# Patient Record
Sex: Male | Born: 1960 | Race: Black or African American | Hispanic: No | Marital: Single | Smoking: Current some day smoker
Health system: Southern US, Community
[De-identification: ages and names within clinical notes are randomized; demographics above are authoritative.]

---

## 1997-12-25 ENCOUNTER — Encounter: Admission: RE | Admit: 1997-12-25 | Discharge: 1997-12-25 | Payer: Self-pay | Admitting: Infectious Diseases

## 1998-01-15 ENCOUNTER — Encounter: Admission: RE | Admit: 1998-01-15 | Discharge: 1998-01-15 | Payer: Self-pay | Admitting: Infectious Diseases

## 1998-09-24 ENCOUNTER — Ambulatory Visit (HOSPITAL_COMMUNITY): Admission: RE | Admit: 1998-09-24 | Discharge: 1998-09-24 | Payer: Self-pay | Admitting: Infectious Diseases

## 1998-09-24 ENCOUNTER — Encounter: Admission: RE | Admit: 1998-09-24 | Discharge: 1998-09-24 | Payer: Self-pay | Admitting: Infectious Diseases

## 1998-12-03 ENCOUNTER — Encounter: Admission: RE | Admit: 1998-12-03 | Discharge: 1998-12-03 | Payer: Self-pay | Admitting: Infectious Diseases

## 2001-03-18 ENCOUNTER — Encounter: Payer: Self-pay | Admitting: Family Medicine

## 2001-03-18 ENCOUNTER — Encounter: Admission: RE | Admit: 2001-03-18 | Discharge: 2001-03-18 | Payer: Self-pay | Admitting: Family Medicine

## 2001-04-07 ENCOUNTER — Encounter: Admission: RE | Admit: 2001-04-07 | Discharge: 2001-07-06 | Payer: Self-pay

## 2001-08-09 ENCOUNTER — Ambulatory Visit (HOSPITAL_COMMUNITY): Admission: RE | Admit: 2001-08-09 | Discharge: 2001-08-09 | Payer: Self-pay | Admitting: Orthopedic Surgery

## 2001-08-09 ENCOUNTER — Encounter: Payer: Self-pay | Admitting: Orthopedic Surgery

## 2008-10-30 ENCOUNTER — Emergency Department (HOSPITAL_COMMUNITY): Admission: EM | Admit: 2008-10-30 | Discharge: 2008-10-30 | Payer: Self-pay | Admitting: Emergency Medicine

## 2013-06-08 ENCOUNTER — Encounter (HOSPITAL_COMMUNITY): Payer: Self-pay | Admitting: Emergency Medicine

## 2013-06-08 ENCOUNTER — Emergency Department (HOSPITAL_COMMUNITY)
Admission: EM | Admit: 2013-06-08 | Discharge: 2013-06-08 | Disposition: A | Discharge status: Home / Self Care | Payer: Self-pay | Attending: Emergency Medicine | Admitting: Emergency Medicine

## 2013-06-08 ENCOUNTER — Emergency Department (HOSPITAL_COMMUNITY): Payer: Self-pay

## 2013-06-08 DIAGNOSIS — F17200 Nicotine dependence, unspecified, uncomplicated: Secondary | ICD-10-CM | POA: Insufficient documentation

## 2013-06-08 DIAGNOSIS — R079 Chest pain, unspecified: Secondary | ICD-10-CM

## 2013-06-08 DIAGNOSIS — R071 Chest pain on breathing: Secondary | ICD-10-CM | POA: Insufficient documentation

## 2013-06-08 LAB — POCT I-STAT TROPONIN I: Troponin i, poc: 0 ng/mL (ref 0.00–0.08)

## 2013-06-08 LAB — CBC
Hemoglobin: 14.3 g/dL (ref 13.0–17.0)
MCH: 29.6 pg (ref 26.0–34.0)
Platelets: 175 10*3/uL (ref 150–400)
RBC: 4.83 MIL/uL (ref 4.22–5.81)
RDW: 13.3 % (ref 11.5–15.5)

## 2013-06-08 LAB — BASIC METABOLIC PANEL
CO2: 28 mEq/L (ref 19–32)
Calcium: 9.3 mg/dL (ref 8.4–10.5)
Chloride: 102 mEq/L (ref 96–112)
GFR calc Af Amer: 77 mL/min — ABNORMAL LOW (ref >90)
GFR calc non Af Amer: 66 mL/min — ABNORMAL LOW (ref >90)
Glucose, Bld: 97 mg/dL (ref 70–99)
Sodium: 139 mEq/L (ref 135–145)

## 2013-06-08 MED ORDER — IBUPROFEN 200 MG PO TABS
400.0000 mg | ORAL_TABLET | Freq: Once | ORAL | Status: AC
  Administered 2013-06-08: 400 mg via ORAL
  Filled 2013-06-08: qty 2

## 2013-06-08 NOTE — ED Provider Notes (Signed)
CSN: [id number]     Arrival date & time 06/08/13  1758 History   First MD Initiated Contact with Patient 06/08/13 2232     Chief Complaint  Patient presents with  . Chest Pain   (Consider location/radiation/quality/duration/timing/severity/associated sxs/prior Treatment) HPI Comments: Jencarlos Nicolson is a 52 y.o. male who complains of left sided, dull, chest pain, radiating to his left arm, for 2 days. The pain is intermittent and caused by movement only. He has not taken anything for it. He does not have any associated with dizziness, nausea, vomiting, sweating, cough, shortness of breath, or abdominal pain. He's never had this previously. There are no other known modifying factors  Patient is a 52 y.o. male presenting with chest pain. The history is provided by the patient.  Chest Pain   History reviewed. No pertinent past medical history. History reviewed. No pertinent past surgical history. No family history on file. History  Substance Use Topics  . Smoking status: Current Some Day Smoker    Types: Cigars  . Smokeless tobacco: Not on file  . Alcohol Use: Yes     Comment: occasional    Review of Systems  Cardiovascular: Positive for chest pain.  All other systems reviewed and are negative.    Allergies  Review of patient's allergies indicates not on file.  Home Medications   Current Outpatient Rx  Name  Route  Sig  Dispense  Refill  . acetaminophen (TYLENOL) 500 MG tablet   Oral   Take 1,000 mg by mouth every 6 (six) hours as needed for pain.          BP 133/95  Pulse 47  Temp(Src) 98 F (36.7 C) (Oral)  Resp 16  Wt 176 lb 11.2 oz (80.151 kg)  SpO2 100% Physical Exam  Nursing note and vitals reviewed. Constitutional: He is oriented to person, place, and time. He appears well-developed and well-nourished.  HENT:  Head: Normocephalic and atraumatic.  Right Ear: External ear normal.  Left Ear: External ear normal.  Eyes: Conjunctivae and EOM are normal. Pupils  are equal, round, and reactive to light.  Neck: Normal range of motion and phonation normal. Neck supple.  Cardiovascular: Normal rate, regular rhythm, normal heart sounds and intact distal pulses.   Pulmonary/Chest: Effort normal and breath sounds normal. He exhibits tenderness (mild left upper anterior chest wall tenderness). He exhibits no bony tenderness.  Abdominal: Soft. Normal appearance. There is no tenderness.  Musculoskeletal: Normal range of motion. He exhibits no edema and no tenderness.  No deformities or large joints  Neurological: He is alert and oriented to person, place, and time. No cranial nerve deficit or sensory deficit. He exhibits normal muscle tone. Coordination normal.  Skin: Skin is warm, dry and intact.  Psychiatric: He has a normal mood and affect. His behavior is normal. Judgment and thought content normal.    ED Course  Procedures (including critical care time) Medications  ibuprofen (ADVIL,MOTRIN) tablet 400 mg (400 mg Oral Given 06/08/13 2303)      Labs Review Labs Reviewed  CBC - Abnormal; Notable for the following:    WBC 3.8 (*)    All other components within normal limits  BASIC METABOLIC PANEL - Abnormal; Notable for the following:    GFR calc non Af Amer 66 (*)    GFR calc Af Amer 77 (*)    All other components within normal limits  POCT I-STAT TROPONIN I   Imaging Review Dg Chest 2 View  06/08/2013  CLINICAL DATA:  Initial encounter for chest pain which began yesterday.  EXAM: CHEST  2 VIEW  COMPARISON:  None.  FINDINGS: Cardiomediastinal silhouette unremarkable. Lungs clear. Bronchovascular markings normal. Pulmonary vascularity normal. No visible pleural effusions. No pneumothorax. Visualized bony thorax intact.  IMPRESSION: Normal examination.   Electronically Signed   By: Hulan Saas M.D.   On: 06/08/2013 19:16    EKG Interpretation     Ventricular Rate:  72 PR Interval:  164 QRS Duration: 78 QT Interval:  366 QTC  Calculation: 400 R Axis:   0 Text Interpretation:  Normal sinus rhythm Right atrial enlargement Nonspecific T wave abnormality Abnormal ECG No old tracing to compare            MDM   1. Chest pain    No specific chest pain, with reassuring, evaluation and clinical syndrome. His pain is most consistent with chest wall source. He is due for discharge with outpatient management.  Nursing Notes Reviewed/ Care Coordinated, and agree without changes. Applicable Imaging Reviewed.  Interpretation of Laboratory Data incorporated into ED treatment   Plan: Home Medications- ibuprofen when necessary pain; Home Treatments and Observation- heat to affected area; return here if the recommended treatment, does not improve the symptoms; Recommended follow up- PCP of choice, and one or 2 weeks, for checkup      Flint Melter, MD 06/08/13 2313

## 2013-06-08 NOTE — ED Notes (Signed)
C/o L sided chest aching and pressure x 2 days.  Pain worse with movement (raising L arm up).  Denies nausea, vomiting, and sob.

## 2013-07-25 ENCOUNTER — Telehealth: Payer: Self-pay | Admitting: "Endocrinology

## 2013-07-27 NOTE — Telephone Encounter (Signed)
Entered this chart in error. I was looking for a different patient with the same name.

## 2014-06-24 ENCOUNTER — Emergency Department (HOSPITAL_COMMUNITY)
Admission: EM | Admit: 2014-06-24 | Discharge: 2014-06-24 | Disposition: A | Discharge status: Home / Self Care | Payer: 59 | Attending: Emergency Medicine | Admitting: Emergency Medicine

## 2014-06-24 ENCOUNTER — Encounter (HOSPITAL_COMMUNITY): Payer: Self-pay | Admitting: Emergency Medicine

## 2014-06-24 DIAGNOSIS — F1210 Cannabis abuse, uncomplicated: Secondary | ICD-10-CM | POA: Diagnosis not present

## 2014-06-24 DIAGNOSIS — R112 Nausea with vomiting, unspecified: Secondary | ICD-10-CM | POA: Diagnosis present

## 2014-06-24 DIAGNOSIS — R110 Nausea: Secondary | ICD-10-CM

## 2014-06-24 DIAGNOSIS — F1410 Cocaine abuse, uncomplicated: Secondary | ICD-10-CM | POA: Diagnosis not present

## 2014-06-24 DIAGNOSIS — Z720 Tobacco use: Secondary | ICD-10-CM | POA: Diagnosis not present

## 2014-06-24 DIAGNOSIS — F1910 Other psychoactive substance abuse, uncomplicated: Secondary | ICD-10-CM

## 2014-06-24 NOTE — ED Notes (Signed)
Bed: WA25 Expected date:  Expected time:  Means of arrival:  Comments: EMS  

## 2014-06-24 NOTE — ED Notes (Addendum)
Per EMS pt reports "feels strange" with vomiting post use of marijuana, four beers, and quarter ounce of crack.  Pt given 4 mg of zofran IV en route.

## 2014-06-24 NOTE — ED Provider Notes (Signed)
CSN: [id number]     Arrival date & time 06/24/14  1340 History   First MD Initiated Contact with Patient 06/24/14 1354     Chief Complaint  Patient presents with  . Emesis      HPI Patient presents to the emergency department after drinking alcohol, smoking cocaine, smoking marijuana.  He states "I just don't feel right".  He was slightly nauseated but received Zofran by EMS and feels much better at this time.  He denies chest pain or shortness of breath.  No abdominal pain.  No headache.  No weakness of his arms or legs.  No hallucinations.  He states he feels better at this time I would like to go home.   History reviewed. No pertinent past medical history. History reviewed. No pertinent past surgical history. No family history on file. History  Substance Use Topics  . Smoking status: Current Some Day Smoker    Types: Cigars  . Smokeless tobacco: Not on file  . Alcohol Use: Yes     Comment: occasional    Review of Systems  All other systems reviewed and are negative.     Allergies  Review of patient's allergies indicates no known allergies.  Home Medications   Prior to Admission medications   Not on File   BP 125/74  Pulse 90  Resp 14  Ht 6' (1.829 m)  Wt 185 lb (83.915 kg)  BMI 25.08 kg/m2  SpO2 99% Physical Exam  Nursing note and vitals reviewed. Constitutional: He is oriented to person, place, and time. He appears well-developed and well-nourished.  HENT:  Head: Normocephalic and atraumatic.  Eyes: EOM are normal.  Neck: Normal range of motion.  Cardiovascular: Normal rate, regular rhythm, normal heart sounds and intact distal pulses.   Pulmonary/Chest: Effort normal and breath sounds normal. No respiratory distress.  Abdominal: Soft. He exhibits no distension. There is no tenderness.  Musculoskeletal: Normal range of motion.  Neurological: He is alert and oriented to person, place, and time.  Skin: Skin is warm and dry.  Psychiatric: He has a normal  mood and affect. Judgment normal.    ED Course  Procedures (including critical care time) Labs Review Labs Reviewed - No data to display  Imaging Review No results found.   EKG Interpretation None      MDM   Final diagnoses:  Nausea  Substance abuse    Patient be discharged home at this time.  Medical screening examination complete.  Vital signs normal.  Discharge home in good condition.    Lyanne CoKevin M Ronica Vivian, MD 06/24/14 [phone]

## 2015-06-21 IMAGING — CR DG CHEST 2V
2 series · 2 of 2 positions shown · non-contrast
Comparison: None.

CLINICAL DATA: Initial encounter for chest pain which began
yesterday.

EXAM:
CHEST  2 VIEW

[w chest pa]
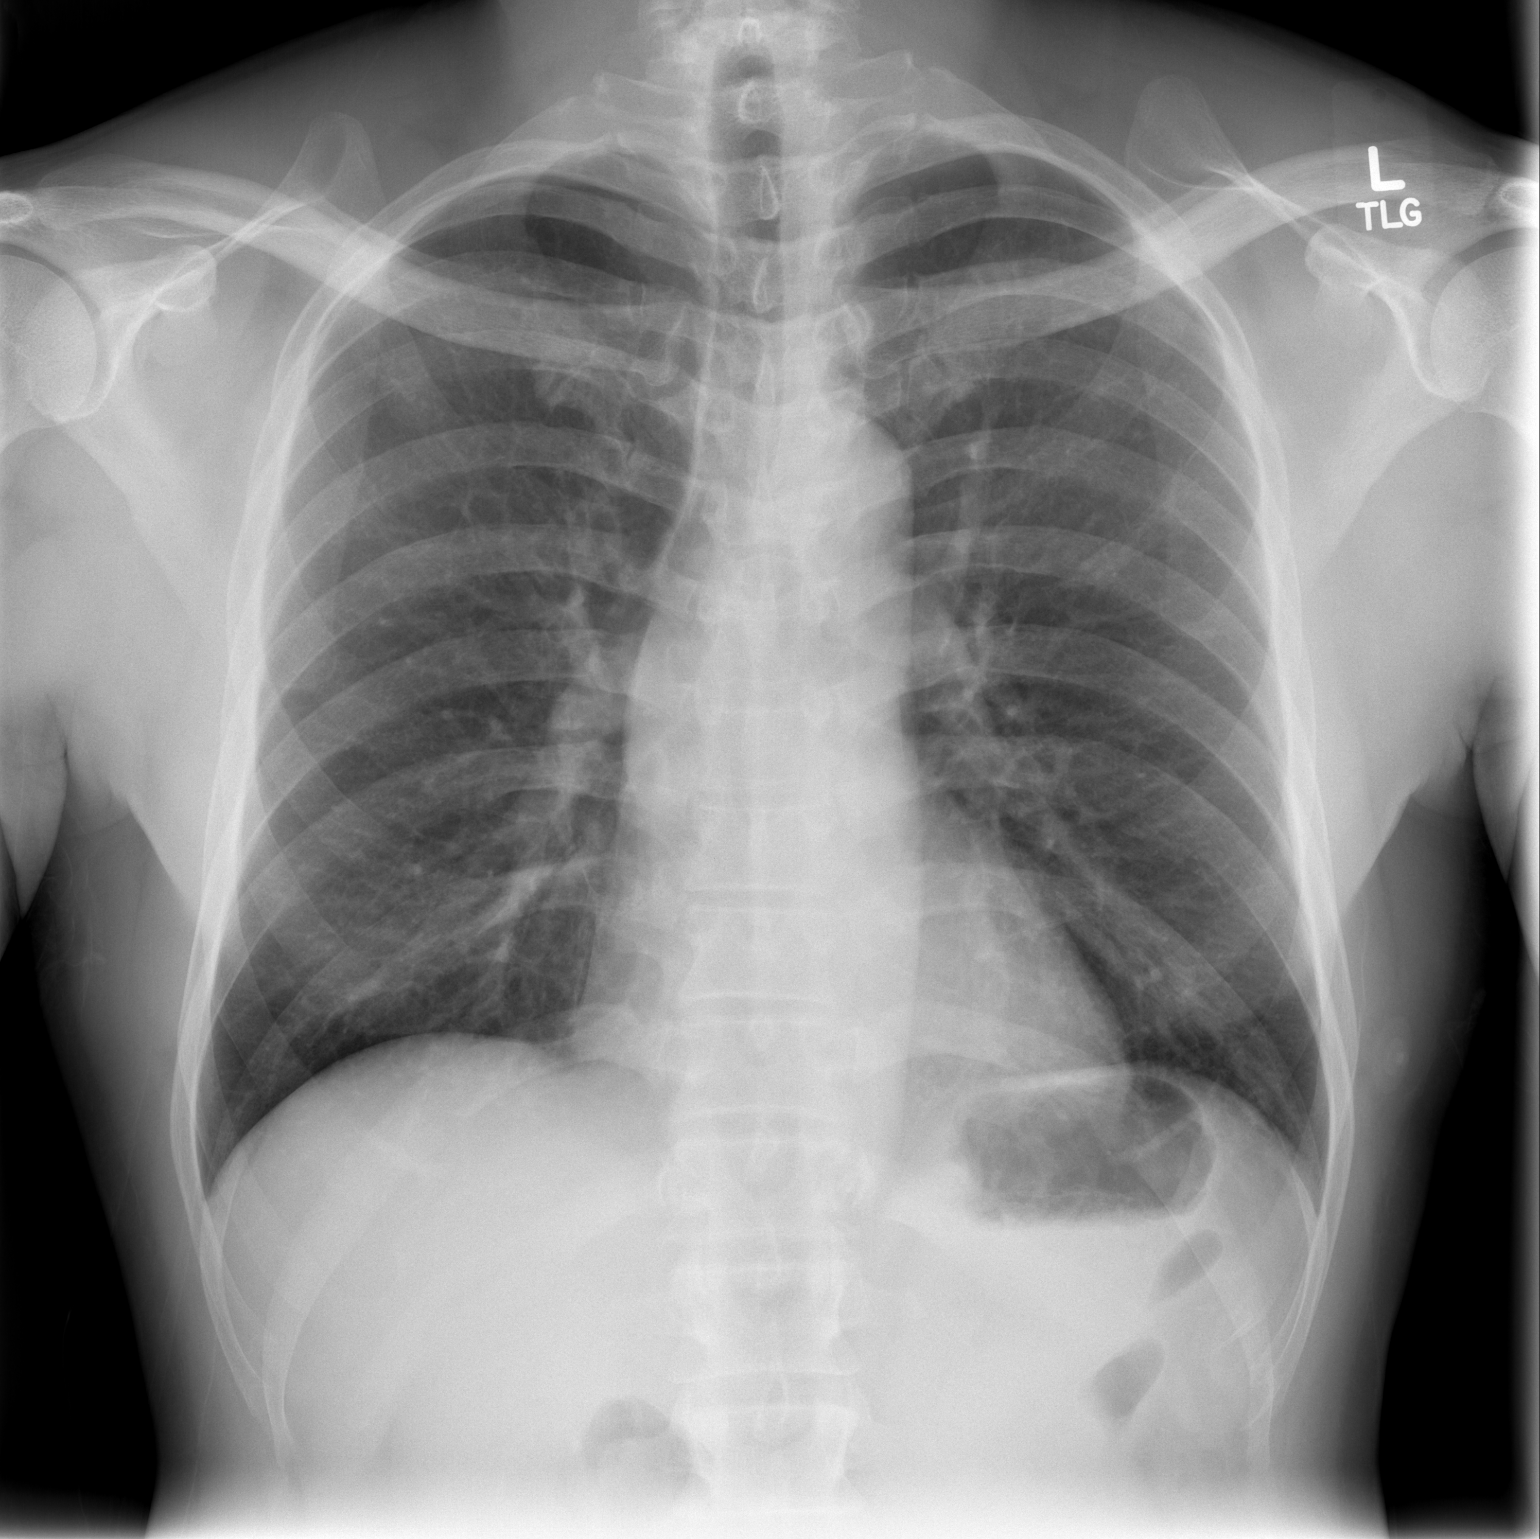

[w chest lat]
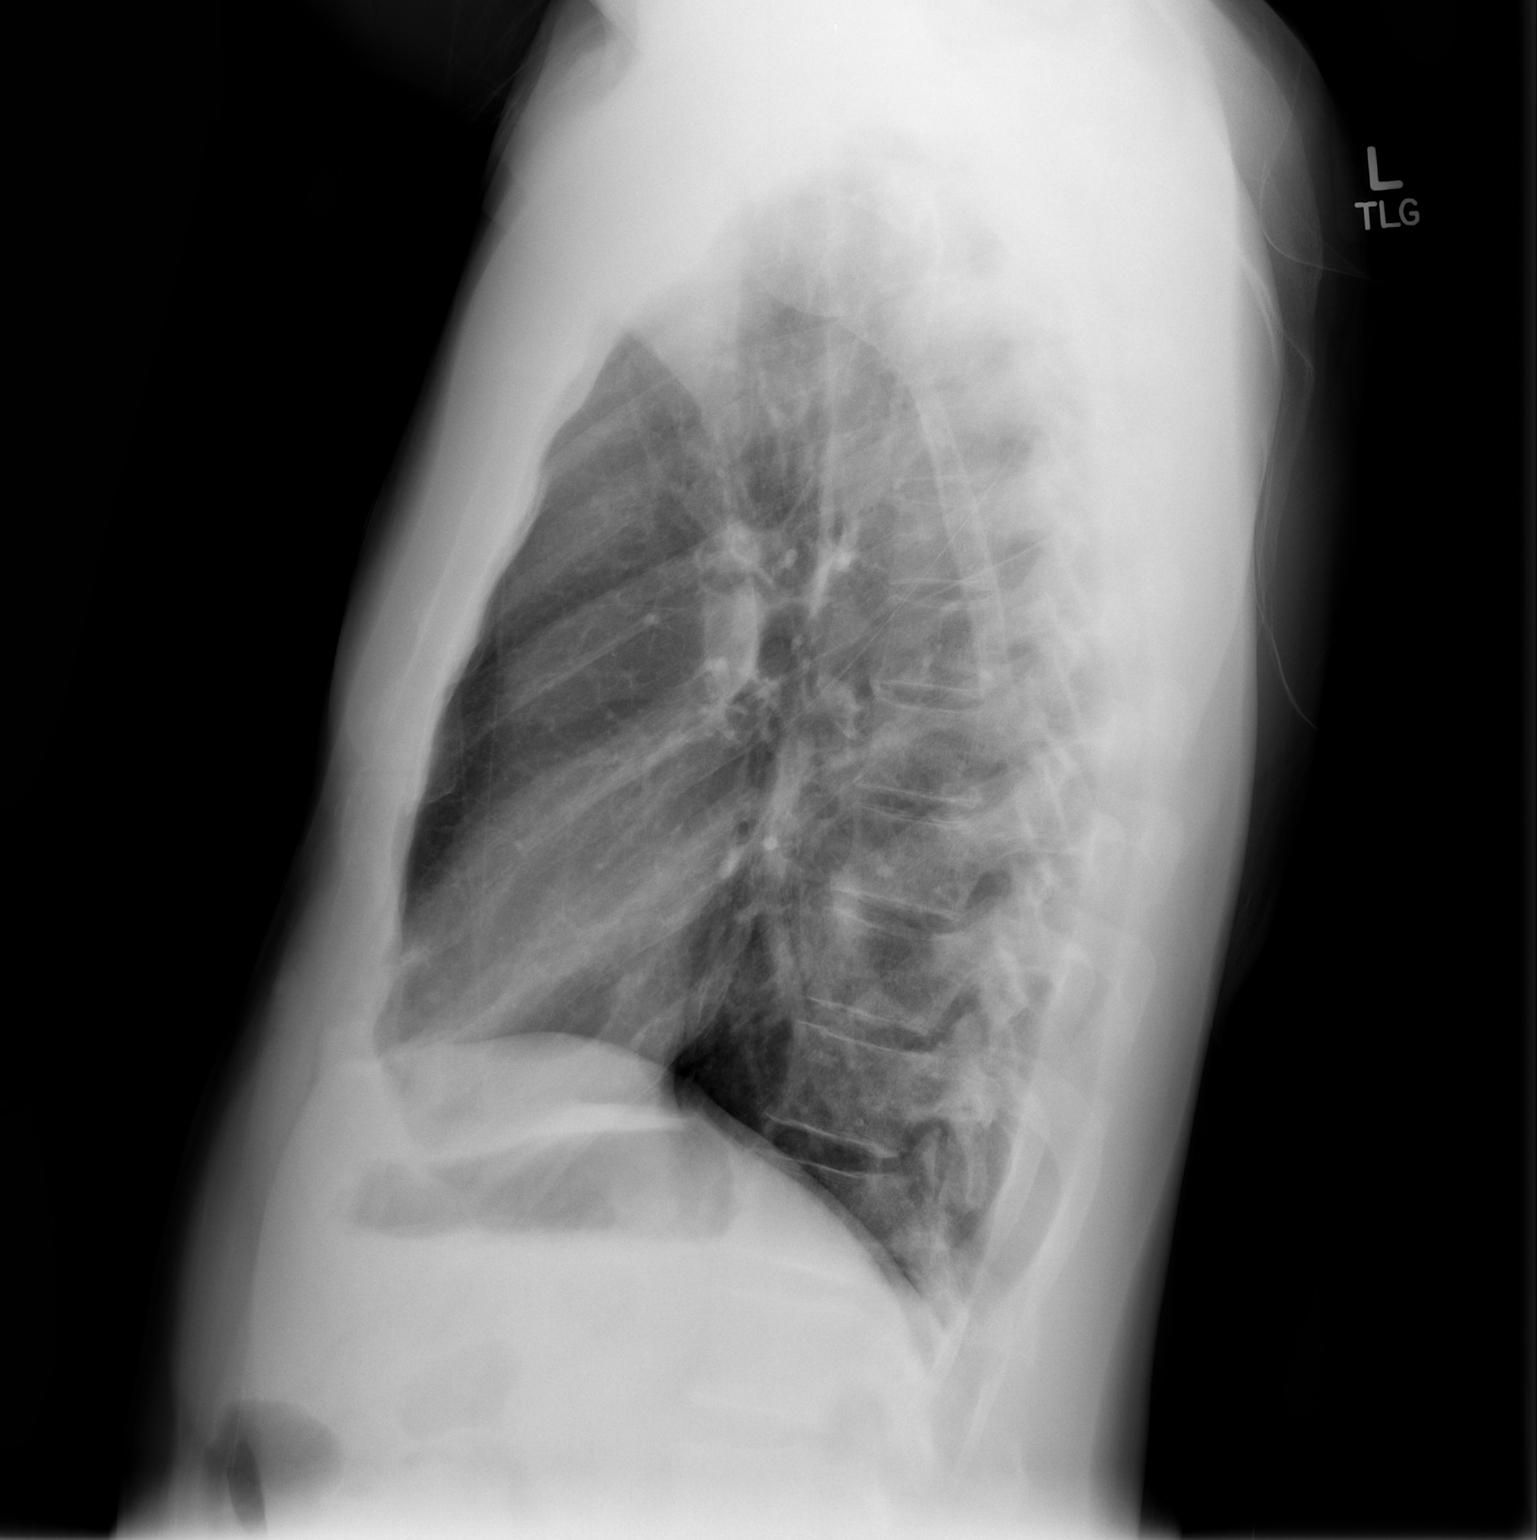

[2 of 2 positions shown; findings below may reference images not displayed]

FINDINGS: Cardiomediastinal silhouette unremarkable. Lungs clear.
Bronchovascular markings normal. Pulmonary vascularity normal. No
visible pleural effusions. No pneumothorax. Visualized bony thorax
intact.
IMPRESSION: Normal examination.

## 2018-01-23 DEATH — deceased
